# Patient Record
Sex: Female | Born: 1995 | Race: Black or African American | Hispanic: No | Marital: Single | Smoking: Current some day smoker
Health system: Southern US, Community
[De-identification: ages and names within clinical notes are randomized; demographics above are authoritative.]

---
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
## 2016-01-27 ENCOUNTER — Encounter (HOSPITAL_COMMUNITY): Payer: Self-pay | Admitting: *Deleted

## 2016-01-27 ENCOUNTER — Emergency Department (HOSPITAL_COMMUNITY)
Admission: EM | Admit: 2016-01-27 | Discharge: 2016-01-27 | Disposition: A | Discharge status: Home / Self Care | Payer: Medicaid Other | Attending: Emergency Medicine | Admitting: Emergency Medicine

## 2016-01-27 ENCOUNTER — Emergency Department (HOSPITAL_COMMUNITY): Payer: Medicaid Other

## 2016-01-27 DIAGNOSIS — Z792 Long term (current) use of antibiotics: Secondary | ICD-10-CM | POA: Insufficient documentation

## 2016-01-27 DIAGNOSIS — Y998 Other external cause status: Secondary | ICD-10-CM | POA: Diagnosis not present

## 2016-01-27 DIAGNOSIS — Y9389 Activity, other specified: Secondary | ICD-10-CM | POA: Diagnosis not present

## 2016-01-27 DIAGNOSIS — Z791 Long term (current) use of non-steroidal anti-inflammatories (NSAID): Secondary | ICD-10-CM | POA: Insufficient documentation

## 2016-01-27 DIAGNOSIS — Z7952 Long term (current) use of systemic steroids: Secondary | ICD-10-CM | POA: Insufficient documentation

## 2016-01-27 DIAGNOSIS — S90111A Contusion of right great toe without damage to nail, initial encounter: Secondary | ICD-10-CM | POA: Insufficient documentation

## 2016-01-27 DIAGNOSIS — W208XXA Other cause of strike by thrown, projected or falling object, initial encounter: Secondary | ICD-10-CM | POA: Diagnosis not present

## 2016-01-27 DIAGNOSIS — Y9289 Other specified places as the place of occurrence of the external cause: Secondary | ICD-10-CM | POA: Insufficient documentation

## 2016-01-27 DIAGNOSIS — S99921A Unspecified injury of right foot, initial encounter: Secondary | ICD-10-CM | POA: Diagnosis present

## 2016-01-27 MED ORDER — ACETAMINOPHEN 325 MG PO TABS
650.0000 mg | ORAL_TABLET | Freq: Once | ORAL | Status: AC
  Administered 2016-01-27: 650 mg via ORAL
  Filled 2016-01-27: qty 2

## 2016-01-27 MED ORDER — NAPROXEN 500 MG PO TABS: 500.0000 mg | ORAL_TABLET | Freq: Two times a day (BID) | ORAL | Status: AC

## 2016-01-27 MED ORDER — HYDROCODONE-ACETAMINOPHEN 5-325 MG PO TABS: 1.0000 | ORAL_TABLET | ORAL | Status: AC | PRN

## 2016-01-27 MED ORDER — NAPROXEN 250 MG PO TABS
500.0000 mg | ORAL_TABLET | Freq: Once | ORAL | Status: AC
  Administered 2016-01-27: 500 mg via ORAL
  Filled 2016-01-27: qty 2

## 2016-01-27 NOTE — ED Provider Notes (Signed)
CSN: [id number]     Arrival date & time 01/27/16  1406 History  By signing my name below, I, Octavia Heir, attest that this documentation has been prepared under the direction and in the presence of Demitrious Mccannon Y Oma Marzan, [place]. Electronically Signed: Octavia Heir, ED Scribe. 01/27/2016. 2:35 PM.    Chief Complaint  Patient presents with  . Toe Pain      The history is provided by the patient. No language interpreter was used.   HPI Comments: Jessica Hopkins is a 20 y.o. female who presents to the Emergency Department complaining of a sudden onset, gradual worsening, moderate, 10/10 right big toe pain with associated swelling onset 40 minutes ago. Pt states that she she was closing the screen door at her house (which is broken) when it fell and landed on her right big toe. Pt says the pain shoots up her right leg and she notes having some numbness. Pt is unable to bend her toes and is able to ambulate but expresses increased pain. She has not taken any medication to alleviate the pain. Denies tingling, weakness.  No past medical history on file. No past surgical history on file. No family history on file. Social History  Substance Use Topics  . Smoking status: Never Smoker   . Smokeless tobacco: Not on file  . Alcohol Use: No   OB History    No data available     Review of Systems  Musculoskeletal: Positive for arthralgias (right big toe).  Neurological: Positive for numbness.  All other systems reviewed and are negative.     Allergies  Review of patient's allergies indicates no known allergies.  Home Medications   Prior to Admission medications   Medication Sig Start Date End Date Taking? Authorizing Provider  amoxicillin (AMOXIL) 500 MG capsule Take 1 capsule (500 mg total) by mouth 3 (three) times daily. 12/01/15   Nicole Pisciotta, PA-C  metroNIDAZOLE (FLAGYL) 500 MG tablet Take 1 tablet (500 mg total) by mouth 2 (two) times daily. 11/16/15   Chase Picket Ward, PA-C  naproxen  (NAPROSYN) 500 MG tablet Take 1 tablet (500 mg total) by mouth 2 (two) times daily. 11/16/15   Chase Picket Ward, PA-C  predniSONE (DELTASONE) 20 MG tablet Take 2 tablets (40 mg total) by mouth daily. 12/01/15   Nicole Pisciotta, PA-C   Triage vitals: BP 125/72 mmHg  Pulse 88  Temp(Src) 97.7 F (36.5 C) (Oral)  Resp 18  Ht  (1.6 m)  Wt 180 lb (81.647 kg)  BMI 31.89 kg/m2  SpO2 100%  LMP 01/27/2016 Physical Exam  Constitutional: She is oriented to person, place, and time. She appears well-developed and well-nourished.  HENT:  Head: Normocephalic.  Eyes: EOM are normal.  Neck: Normal range of motion.  Pulmonary/Chest: Effort normal.  Abdominal: She exhibits no distension.  Musculoskeletal: Normal range of motion.  Right toe is not edematous or erythematous. Right great toe with tenderness at base of nail and IP joint. Brisk cap refill, limited ROM due to pain, 2+ distal pulses.   Neurological: She is alert and oriented to person, place, and time.  Psychiatric: She has a normal mood and affect.  Nursing note and vitals reviewed.   ED Course  Procedures  DIAGNOSTIC STUDIES: Oxygen Saturation is 100% on RA, normal by my interpretation.  COORDINATION OF CARE:  2:22 PM Will order DG of right foot. Discussed treatment plan which includes naproxen and tylenol with pt at bedside and pt agreed to plan.  Labs  Review Labs Reviewed - No data to display  Imaging Review Dg Foot Complete Right  01/27/2016  CLINICAL DATA:  20 year old female with a history of right foot pain at the great toe. Injury EXAM: RIGHT FOOT COMPLETE - 3+ VIEW COMPARISON:  05/09/2015 FINDINGS: There is no evidence of fracture or dislocation. There is no evidence of arthropathy or other focal bone abnormality. Soft tissues are unremarkable. IMPRESSION: Negative. Signed, Yvone NeuJaime S. Loreta AveWagner, DO [facility] Electronically Signed   By: Gilmer MorJaime  Wagner D.O.   On:  01/27/2016 15:15   I have personally reviewed and evaluated these images and lab results as part of my medical decision-making.   EKG Interpretation None      MDM   Final diagnoses:  Contusion of great toe, right, initial encounter   X-ray negative for acute findings. Pt reports continued severe pain minimally improved with naproxen and tylenol. She is driving home. Discussed with her i can give her short course of norco plus naproxen at home. Encouraged RICE therapy. Great toe and second toe buddy taped for support. Resource guide given to establish PCP for f/u. ER return precautions given.   I personally performed the services described in this documentation, which was scribed in my presence. The recorded information has been reviewed and is accurate.   Carlene CoriaSerena Y Colleene Swarthout, PA-C 01/27/16 1544  Margarita Grizzleanielle Ray, MD 01/28/16 [id number]

## 2016-01-27 NOTE — Discharge Instructions (Signed)
Take pain medications as needed for pain. Keep your foot elevated when possible. Use ice on and off for the next 48 hours. Return to the emergency room for worsening condition or new concerning symptoms. Follow up with your regular doctor. If you don't have a regular doctor use one of the numbers below to establish a primary care doctor.   Emergency Department Resource Guide 1) Find a Doctor and Pay Out of Pocket Although you won't have to find out who is covered by your insurance plan, it is a good idea to ask around and get recommendations. You will then need to call the office and see if the doctor you have chosen will accept you as a new patient and what types of options they offer for patients who are self-pay. Some doctors offer discounts or will set up payment plans for their patients who do not have insurance, but you will need to ask so you aren't surprised when you get to your appointment.  2) Contact Your Local Health Department Not all health departments have doctors that can see patients for sick visits, but many do, so it is worth a call to see if yours does. If you don't know where your local health department is, you can check in your phone book. The CDC also has a tool to help you locate your state's health department, and many state websites also have listings of all of their local health departments.  3) Find a Walk-in Clinic If your illness is not likely to be very severe or complicated, you may want to try a walk in clinic. These are popping up all over the country in pharmacies, drugstores, and shopping centers. They're usually staffed by nurse practitioners or physician assistants that have been trained to treat common illnesses and complaints. They're usually fairly quick and inexpensive. However, if you have serious medical issues or chronic medical problems, these are probably not your best option.  No Primary Care Doctor: - Call Health Connect at  [phone] - they can help you  locate a primary care doctor that  accepts your insurance, provides certain services, etc. - Physician Referral [phone]  Emergency Department Resource Guide 1) Find a Doctor and Pay Out of Pocket Although you won't have to find out who is covered by your insurance plan, it is a good idea to ask around and get recommendations. You will then need to call the office and see if the doctor you have chosen will accept you as a new patient and what types of options they offer for patients who are self-pay. Some doctors offer discounts or will set up payment plans for their patients who do not have insurance, but you will need to ask so you aren't surprised when you get to your appointment.  2) Contact Your Local Health Department Not all health departments have doctors that can see patients for sick visits, but many do, so it is worth a call to see if yours does. If you don't know where your local health department is, you can check in your phone book. The CDC also has a tool to help you locate your state's health department, and many state websites also have listings of all of their local health departments.  3) Find a Walk-in Clinic If your illness is not likely to be very severe or complicated, you may want to try a walk in clinic. These are popping up all over the country in pharmacies, drugstores, and shopping centers. They're usually staffed  by nurse practitioners or physician assistants that have been trained to treat common illnesses and complaints. They're usually fairly quick and inexpensive. However, if you have serious medical issues or chronic medical problems, these are probably not your best option.  No Primary Care Doctor: - Call Health Connect at  [phone] - they can help you locate a primary care doctor that  accepts your insurance, provides certain services, etc. - Physician Referral Service- [phone]  Chronic Pain Problems: Organization         Address  Phone    Notes  [facility]  [phone] Patients need to be referred by their primary care doctor.   Medication Assistance: Organization         Address  Phone   Notes  [facility] Medication [facility] [address] [place], [place] [address] [phone] --Must be a resident of [facility] [place] -- Must have NO insurance coverage whatsoever (no Medicaid/ Medicare, etc.) -- The pt. MUST have a primary care doctor that directs their care regularly and follows them in the community   MedAssist  [phone]   [employer]  [phone]    Agencies that provide inexpensive medical care: Organization         Address  Phone   Notes  [facility]  [phone]   [facility]    [phone]   [facility] [address] [place], [place] [address] [phone]   [facility] [address], [place] [phone]   [facility]    [phone]) [phone]   [facility]    [phone]   [facility]  [address], [place] Phone:  [phone], Fax:  [phone] Hours of Operation:  9 am - 6 pm, M-F.  Also accepts Medicaid/Medicare and self-pay.  [facility]  [address], [place] Phone: [phone], Fax: [phone]. Hours of Operation:  8:30 am - 5:30 pm, M-F.  Also accepts Medicaid and self-pay.  [facility] [place] [address], [place] Phone: [phone]) [phone]   [facility] [address], [place], [place] [phone], Ext. [phone] Mondays & Thursdays: 7-9 AM.  First 15 patients are seen on a first come, first serve basis.    Medicaid-accepting [facility] Providers:  Organization         Address  Phone   Notes  [facility] [address], [place] [phone] Also accepts self-pay patients.  [facility]  [address], [place]  [phone]   [facility] [address], [place] [phone]   [facility] [address] [place] [address], [place] [phone]   [facility]akers[facility] Bland [address], [place]   [phone] Only accepts [place] patients after they have their name applied to their card.   Self-Pay (no insurance) in [facility] [place]:  Organization         Address  Phone   Notes  Sickle Cell Patients, [facility] [address], [place] [phone]   [facility] [address], [place] [phone]   [facility]  [address], [place] [phone]) [phone]   [facility]/Dr.  Osei-Bonsu  [address] [place] or [address], [place] [phone] Phone number for both [place] and [place] locations is the same.  [facility] [address] [place] [phone]   [facility] [address], [place] or [address] [phone]   [facility] [address], [place] [phone], phone; [phone], fax Sees patients 1st and 3rd Saturday of every month.  Must not qualify for public or private insurance (i.e. Medicaid, Medicare, [place] Health Choice, Veterans' Benefits)  Household income should be no more than 200% of the poverty level The clinic cannot treat you if you are pregnant or think you are pregnant  Sexually transmitted diseases are not treated at the clinic.

## 2016-01-27 NOTE — ED Notes (Signed)
PT reports a door fell on her RT great toe. Pain 10/10

## 2016-02-05 ENCOUNTER — Encounter (HOSPITAL_COMMUNITY): Payer: Self-pay

## 2016-02-05 ENCOUNTER — Emergency Department (HOSPITAL_COMMUNITY)
Admission: EM | Admit: 2016-02-05 | Discharge: 2016-02-05 | Discharge status: Absent without leave | Payer: Medicaid Other

## 2016-02-05 ENCOUNTER — Emergency Department (HOSPITAL_COMMUNITY)
Admission: EM | Admit: 2016-02-05 | Discharge: 2016-02-05 | Disposition: A | Discharge status: Home / Self Care | Payer: Medicaid Other | Attending: Emergency Medicine | Admitting: Emergency Medicine

## 2016-02-05 DIAGNOSIS — Z7952 Long term (current) use of systemic steroids: Secondary | ICD-10-CM | POA: Diagnosis not present

## 2016-02-05 DIAGNOSIS — R195 Other fecal abnormalities: Secondary | ICD-10-CM | POA: Diagnosis present

## 2016-02-05 DIAGNOSIS — Z792 Long term (current) use of antibiotics: Secondary | ICD-10-CM | POA: Diagnosis not present

## 2016-02-05 DIAGNOSIS — Z791 Long term (current) use of non-steroidal anti-inflammatories (NSAID): Secondary | ICD-10-CM | POA: Insufficient documentation

## 2016-02-05 NOTE — ED Notes (Signed)
Patient reports that she has had 2 bowel movements and noticed a pinkish/white "worm" in stool. No distress, no pain

## 2016-02-05 NOTE — ED Notes (Signed)
PT provided with stool collection kit and order placed by PA

## 2016-02-05 NOTE — ED Notes (Signed)
Declined W/C at D/C and was escorted to lobby by RN. 

## 2016-02-05 NOTE — Discharge Instructions (Signed)
You were given a stool sample collection kit today. Please provide a stool sample and bring to the lab for testing. Return to the emergency department for any new or worsening symptoms, any additional concerns.

## 2016-02-05 NOTE — ED Provider Notes (Signed)
CSN: [id number]     Arrival date & time 02/05/16  6314 History  By signing my name below, I, Jessica Hopkins, attest that this documentation has been prepared under the direction and in the presence of Jessica Oyster, PA-C Electronically Signed: Ladene Artist, ED Scribe 02/05/2016 at 10:51 AM   Chief Complaint  Patient presents with  . wants stool checked/worms    The history is provided by the patient. No language interpreter was used.   HPI Comments: Jessica Hopkins is a 20 y.o. female who presents to the Emergency Department to have her stool checked. Pt states that she had 2 BMs and noticed 2 long pinkish/white "worms" in her stools 6 days ago and again 3 days ago. Pt further describes the worms as "slimy" and states that she noticed them on her toilet tissue after wiping. Pt denies fever, diarrhea, constipation, abdominal pain, blood in stool. She also denies new foods, raw foods, recent travel, swimming in lakes or oceans.    History reviewed. No pertinent past medical history. History reviewed. No pertinent past surgical history. No family history on file. Social History  Substance Use Topics  . Smoking status: Never Smoker   . Smokeless tobacco: None  . Alcohol Use: No   OB History    No data available     Review of Systems  Constitutional: Negative for fever.  Gastrointestinal: Negative for abdominal pain, diarrhea, constipation and blood in stool.   Allergies  Review of patient's allergies indicates no known allergies.  Home Medications   Prior to Admission medications   Medication Sig Start Date End Date Taking? Authorizing Provider  amoxicillin (AMOXIL) 500 MG capsule Take 1 capsule (500 mg total) by mouth 3 (three) times daily. 12/01/15   Nicole Pisciotta, PA-C  HYDROcodone-acetaminophen (NORCO/VICODIN) 5-325 MG tablet Take 1 tablet by mouth every 4 (four) hours as needed for severe pain. 01/27/16   Olivia Canter Sam, PA-C  metroNIDAZOLE (FLAGYL) 500 MG tablet Take 1 tablet (500  mg total) by mouth 2 (two) times daily. 11/16/15   Ozella Almond Clarke Amburn, PA-C  naproxen (NAPROSYN) 500 MG tablet Take 1 tablet (500 mg total) by mouth 2 (two) times daily. 11/16/15   Ozella Almond Marshal Schrecengost, PA-C  naproxen (NAPROSYN) 500 MG tablet Take 1 tablet (500 mg total) by mouth 2 (two) times daily. 01/27/16   Olivia Canter Sam, PA-C  predniSONE (DELTASONE) 20 MG tablet Take 2 tablets (40 mg total) by mouth daily. 12/01/15   Nicole Pisciotta, PA-C   BP 126/112 mmHg  Pulse 75  Temp(Src) 98.4 F (36.9 C) (Oral)  Resp 18  Ht '5\' 3"'  (1.6 m)  Wt 180 lb (81.647 kg)  BMI 31.89 kg/m2  SpO2 94%  LMP 01/27/2016 Physical Exam  Constitutional: She is oriented to person, place, and time. She appears well-developed and well-nourished. No distress.  HENT:  Head: Normocephalic and atraumatic.  Neck: Neck supple. No tracheal deviation present.  Cardiovascular: Normal rate, regular rhythm and normal heart sounds.   Pulmonary/Chest: Effort normal and breath sounds normal. No respiratory distress. She has no wheezes. She has no rales. She exhibits no tenderness.  Abdominal: Soft. Bowel sounds are normal. She exhibits no distension and no mass. There is no tenderness. There is no rebound and no guarding.  Musculoskeletal: Normal range of motion.  Neurological: She is alert and oriented to person, place, and time.  Skin: Skin is warm and dry.  Psychiatric: She has a normal mood and affect. Her behavior is normal.  Nursing  note and vitals reviewed.  ED Course  Procedures (including critical care time) DIAGNOSTIC STUDIES: Oxygen Saturation is 94% on RA, adequate by my interpretation.    COORDINATION OF CARE: 10:08 AM-Discussed treatment plan which includes ova and parasite examination and f/u with PCP with pt at bedside and pt agreed to plan.   Labs Review Labs Reviewed - No data to display  Imaging Review No results found.   EKG Interpretation None      MDM   Final diagnoses:  Abnormal stools    [name] presents to the emergency Department were courier of having worms in her stool. She has twice in the last week they just a long thin slimy wormlike structure on her tissue paper when wiping after a bowel movement. She has no risk factors including no recent travel, swimming in outside water, no raw foods, no new foods, no blood in the stool, no abdominal symptoms. She presents today asymptomatic with a benign abdominal exam. She does not currently need to have a bowel movement, therefore cannot obtain a stool sample at this time. Case was discussed with attending Dr. Johnney Killian who recommends setting up an outpatient ova and parasites stool sample. Lab was contacted to arrange this. Patient was sent home with a stool collection kit and orders placed for O&P to be performed lab. PCP follow up strongly encouraged. Return precautions were given and all questions answered.  I personally performed the services described in this documentation, which was scribed in my presence. The recorded information has been reviewed and is accurate.   [facility] Chauna Osoria, PA-C 02/05/16 1129  Charlesetta Shanks, MD 02/05/16 [phone]

## 2016-02-08 LAB — O&P RESULT

## 2016-02-08 LAB — OVA + PARASITE EXAM

## 2016-07-10 IMAGING — CR DG FOOT COMPLETE 3+V*R*
3 series · 3 of 3 positions shown · non-contrast
Comparison: None.

CLINICAL DATA: Stepped on glass.

EXAM:
RIGHT FOOT COMPLETE - 3+ VIEW

[foot ap]
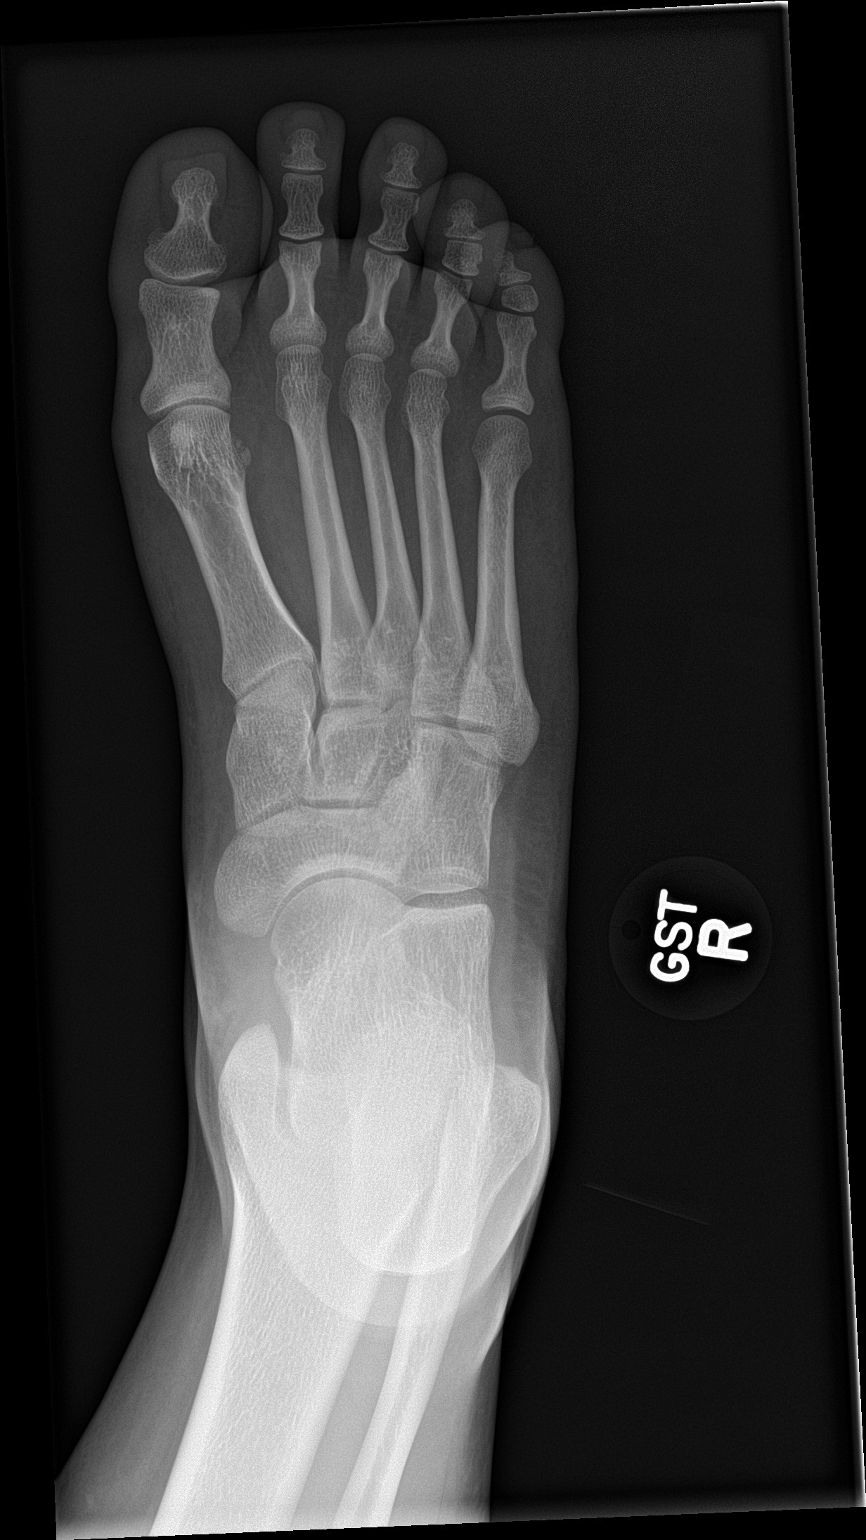

[foot obl]
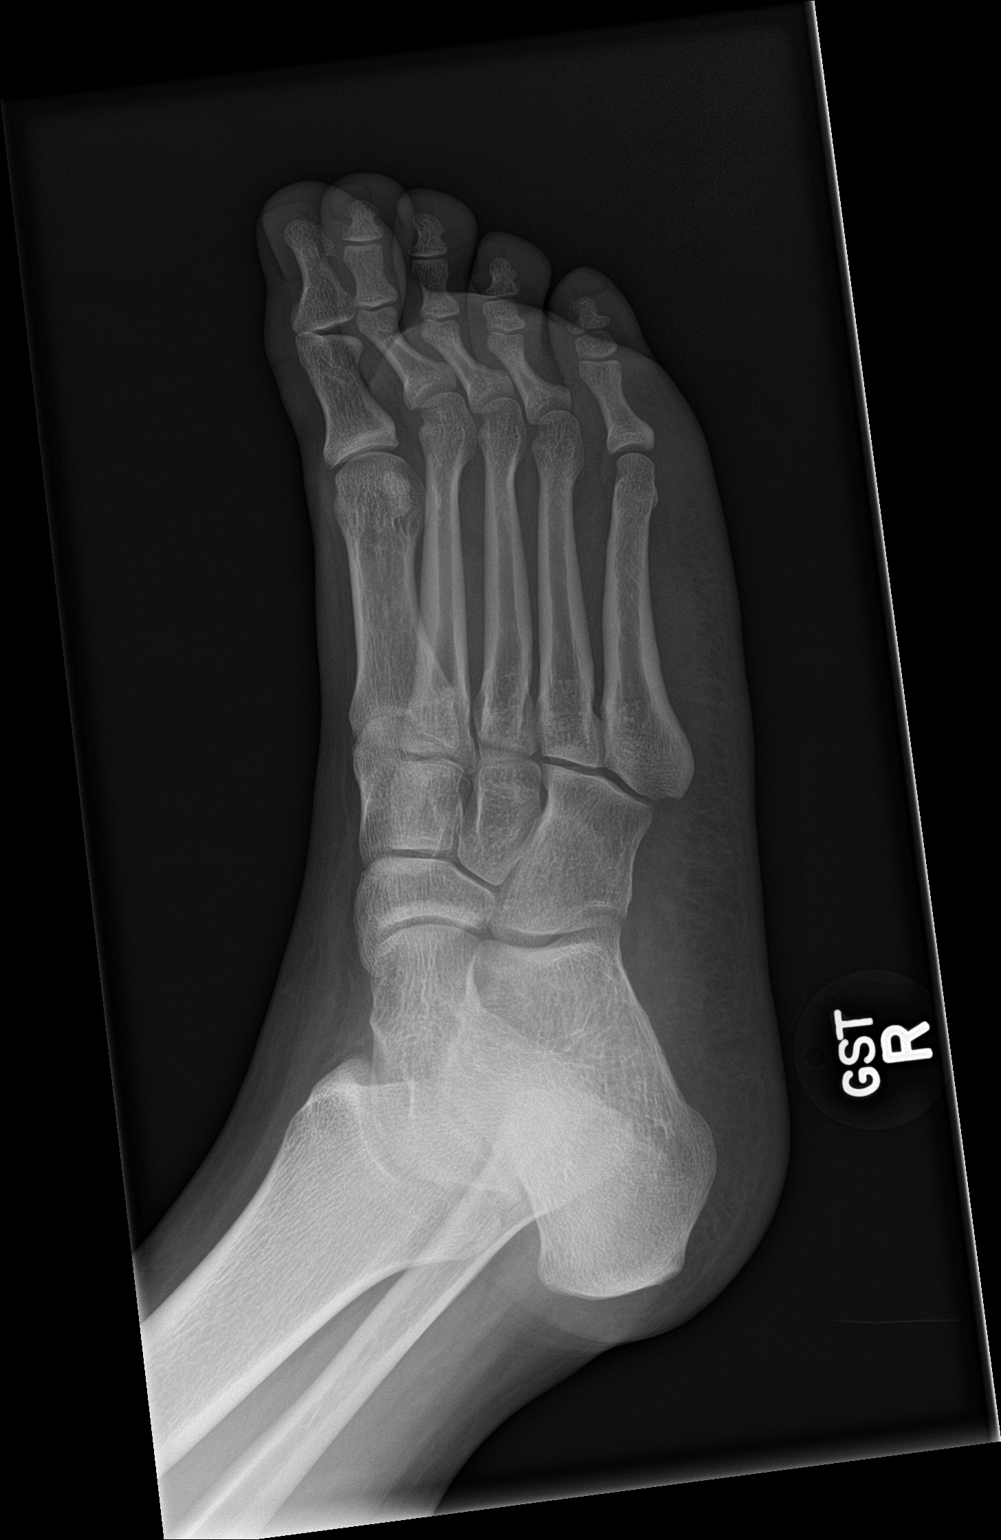

[foot lat]
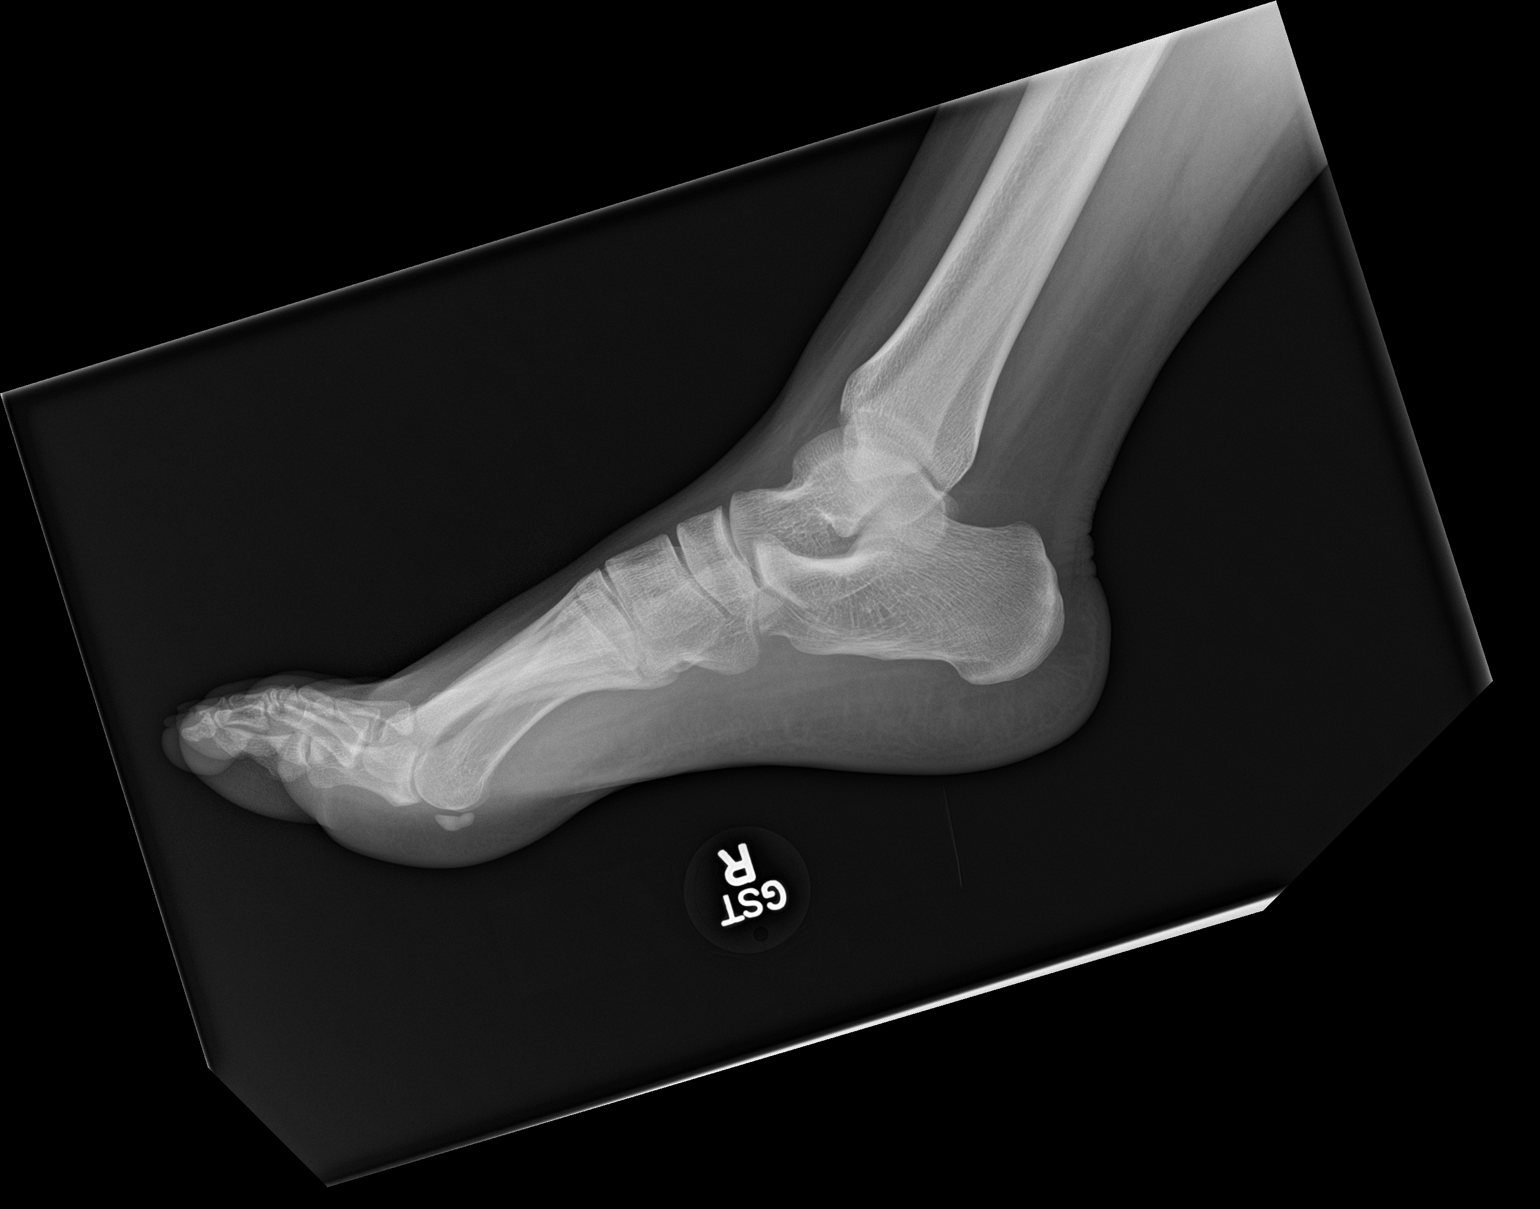

[3 of 3 positions shown; findings below may reference images not displayed]

FINDINGS: Negative for fracture, dislocation or radiopaque foreign body. No
soft tissue gas.
IMPRESSION: Negative.

## 2017-01-10 ENCOUNTER — Encounter (HOSPITAL_COMMUNITY): Payer: Self-pay | Admitting: Nurse Practitioner

## 2017-01-10 ENCOUNTER — Emergency Department (HOSPITAL_COMMUNITY)
Admission: EM | Admit: 2017-01-10 | Discharge: 2017-01-10 | Disposition: A | Discharge status: Home / Self Care | Payer: Medicaid Other | Attending: Emergency Medicine | Admitting: Emergency Medicine

## 2017-01-10 DIAGNOSIS — A5901 Trichomonal vulvovaginitis: Secondary | ICD-10-CM | POA: Insufficient documentation

## 2017-01-10 DIAGNOSIS — B9689 Other specified bacterial agents as the cause of diseases classified elsewhere: Secondary | ICD-10-CM | POA: Diagnosis not present

## 2017-01-10 DIAGNOSIS — N390 Urinary tract infection, site not specified: Secondary | ICD-10-CM | POA: Diagnosis not present

## 2017-01-10 DIAGNOSIS — F17200 Nicotine dependence, unspecified, uncomplicated: Secondary | ICD-10-CM | POA: Insufficient documentation

## 2017-01-10 DIAGNOSIS — Z79899 Other long term (current) drug therapy: Secondary | ICD-10-CM | POA: Insufficient documentation

## 2017-01-10 DIAGNOSIS — A599 Trichomoniasis, unspecified: Secondary | ICD-10-CM

## 2017-01-10 DIAGNOSIS — R350 Frequency of micturition: Secondary | ICD-10-CM | POA: Diagnosis present

## 2017-01-10 DIAGNOSIS — N760 Acute vaginitis: Secondary | ICD-10-CM | POA: Diagnosis not present

## 2017-01-10 LAB — URINALYSIS, ROUTINE W REFLEX MICROSCOPIC
Bilirubin Urine: NEGATIVE
Glucose, UA: NEGATIVE mg/dL
Ketones, ur: 5 mg/dL — AB
Nitrite: POSITIVE — AB
Protein, ur: 100 mg/dL — AB
SPECIFIC GRAVITY, URINE: 1.02 (ref 1.005–1.030)
pH: 6 (ref 5.0–8.0)

## 2017-01-10 LAB — POC URINE PREG, ED: PREG TEST UR: NEGATIVE

## 2017-01-10 LAB — WET PREP, GENITAL
Sperm: NONE SEEN
Yeast Wet Prep HPF POC: NONE SEEN

## 2017-01-10 MED ORDER — AZITHROMYCIN 250 MG PO TABS
1000.0000 mg | ORAL_TABLET | Freq: Once | ORAL | Status: AC
  Administered 2017-01-10: 1000 mg via ORAL
  Filled 2017-01-10: qty 4

## 2017-01-10 MED ORDER — METRONIDAZOLE 500 MG PO TABS: 500.0000 mg | ORAL_TABLET | Freq: Two times a day (BID) | ORAL | 0 refills | Status: AC

## 2017-01-10 MED ORDER — CEFTRIAXONE SODIUM 250 MG IJ SOLR
250.0000 mg | Freq: Once | INTRAMUSCULAR | Status: AC
  Administered 2017-01-10: 250 mg via INTRAMUSCULAR
  Filled 2017-01-10: qty 250

## 2017-01-10 MED ORDER — LIDOCAINE HCL (PF) 1 % IJ SOLN
INTRAMUSCULAR | Status: AC
  Filled 2017-01-10: qty 5

## 2017-01-10 MED ORDER — CIPROFLOXACIN HCL 250 MG PO TABS
250.0000 mg | ORAL_TABLET | Freq: Two times a day (BID) | ORAL | 0 refills | Status: AC
Start: 2017-01-11 — End: 2017-01-16

## 2017-01-10 NOTE — ED Triage Notes (Addendum)
Pt presents with c/o urinary frequency. The urinary frequency began 3 days ago. She reports urinary hesitancy, urinary pressure, foul smelling vaginal discharge, blood on toilet paper after urinating.  She denies fevers, chills, nausea, vomiting, abd pain, flank pain. She has not tried anything at home for her symptoms.

## 2017-01-10 NOTE — Discharge Instructions (Signed)
Jessica Hopkins,  Your workup shows that your symptoms may be related to a urinary tract infection, trichomoniasis, and bacterial vaginosis. While you were in the emergency department we treated you for chlamydia and gonorrhea just in case but the results of whether or not you have these infections have not come back yet. You will get a call if these results end up being positive and hear nothing if they are negative. Please complete the courses of these other two antibiotics, follow the directions on the bottles. Trichomoniasis and bacterial vaginosis can be transmitted sexually so your partner will need to be treated as well to prevent you from getting these infections again. Schedule a follow up appointment with a primary care doctor. Return to the emergency department if your symptoms worsen or fail to improve.

## 2017-01-10 NOTE — ED Provider Notes (Signed)
[facility] DEPT Provider Note   CSN: [id number] Arrival date & time: 01/10/17  1539     History   Chief Complaint Chief Complaint  Patient presents with  . Urinary Frequency    HPI Jessica Hopkins is a 21 y.o. female presents with frequency and incomplete bladder emptying and fishy vaginal discharge for the past 4 days. She also noticed clots of blood when she wiped after urinating yesterday. LMP was in early march, she cannot recall the dates. She denies dysuria, abdominal pain, back pain, changes in bowel movements, fever, or nausea. She has been with 1 sexual partner in the past 6 months. They do not use contraceptives.   HPI  History reviewed. No pertinent past medical history.  There are no active problems to display for this patient.   History reviewed. No pertinent surgical history.  OB History    No data available       Home Medications    Prior to Admission medications   Medication Sig Start Date End Date Taking? Authorizing Provider  amoxicillin (AMOXIL) 500 MG capsule Take 1 capsule (500 mg total) by mouth 3 (three) times daily. 12/01/15   Nicole Pisciotta, PA-C  HYDROcodone-acetaminophen (NORCO/VICODIN) 5-325 MG tablet Take 1 tablet by mouth every 4 (four) hours as needed for severe pain. 01/27/16   Ace Gins Sam, PA-C  metroNIDAZOLE (FLAGYL) 500 MG tablet Take 1 tablet (500 mg total) by mouth 2 (two) times daily. 11/16/15   Chase Picket Ward, PA-C  naproxen (NAPROSYN) 500 MG tablet Take 1 tablet (500 mg total) by mouth 2 (two) times daily. 11/16/15   Chase Picket Ward, PA-C  naproxen (NAPROSYN) 500 MG tablet Take 1 tablet (500 mg total) by mouth 2 (two) times daily. 01/27/16   Ace Gins Sam, PA-C  predniSONE (DELTASONE) 20 MG tablet Take 2 tablets (40 mg total) by mouth daily. 12/01/15   Joni Reining Pisciotta, PA-C    Family History History reviewed. No pertinent family history.  Social History Social History  Substance Use Topics  . Smoking status: Current Some  Day Smoker  . Smokeless tobacco: Never Used  . Alcohol use No     Allergies   Patient has no known allergies.   Review of Systems Review of Systems  Constitutional: Negative for chills and fever.  Gastrointestinal: Negative for abdominal pain, constipation, diarrhea and nausea.  Genitourinary: Positive for decreased urine volume, difficulty urinating, urgency and vaginal discharge. Negative for dysuria, flank pain and pelvic pain.  Musculoskeletal: Negative for back pain.  All other systems reviewed and are negative.    Physical Exam Updated Vital Signs BP 128/71 (BP Location: Right Arm)   Pulse 97   Temp 98.2 F (36.8 C) (Oral)   Resp 18   LMP 12/12/2016   SpO2 99%   Physical Exam  Constitutional: She is oriented to person, place, and time. She appears well-developed and well-nourished. No distress.  Non toxic appearing   HENT:  Head: Normocephalic and atraumatic.  Eyes: Conjunctivae are normal. No scleral icterus.  Cardiovascular: Normal rate and regular rhythm.   No murmur heard. Pulmonary/Chest: Effort normal and breath sounds normal. No respiratory distress. She has no wheezes. She has no rales.  Abdominal: Soft. She exhibits no distension and no mass. There is no tenderness. There is no guarding.  She feels fullness with suprapubic palpation  No costovertebral angle tenderness   Genitourinary: Vagina normal. There is no rash or lesion on the right labia. There is no rash or lesion on the  left labia. Cervix exhibits no discharge and no friability. Right adnexum displays no mass, no tenderness and no fullness. Left adnexum displays no mass, no tenderness and no fullness. No erythema or tenderness in the vagina. No vaginal discharge found.  Neurological: She is alert and oriented to person, place, and time.  Skin: Skin is warm and dry. She is not diaphoretic.  Psychiatric: She has a normal mood and affect. Her behavior is normal.     ED Treatments / Results   Labs (all labs ordered are listed, but only abnormal results are displayed) Labs Reviewed  URINALYSIS, ROUTINE W REFLEX MICROSCOPIC  POC URINE PREG, ED  WET PREP  (BD AFFIRM) ([place])  GC/CHLAMYDIA PROBE AMP ([place]) NOT AT [facility]    EKG  EKG Interpretation None       Radiology No results found.  Procedures Procedures (including critical care time)  Medications Ordered in ED Medications - No data to display   Initial Impression / Assessment and Plan / ED Course  I have reviewed the triage vital signs and the nursing notes.  Pertinent labs & imaging results that were available during my care of the patient were reviewed by me and considered in my medical decision making (see chart for details).    21 year old female presents with sensation of incomplete bladder emptying, urinary frequency, and fishy vaginal discharge. She was non toxic appearing with stable vital signs. Vaginal exam revealed no lesions. Wet prep was positive for trichomoniasis and clue cells. Urinalysis showed large leukocytes and nitrites. Gonorrhea and chlamydia results are not back yet but I have given her a dose of rocephin 250 mg IM and azithromax 1 g empirically. Prescribed Cipro 250 mg BID for 3 days and Metronidazole 500 mg BID for 7 days. Planned for close follow up with a PCP and instructed her to have sexual partner treated for trichomoniasis. Discussed return precautions.    Final Clinical Impressions(s) / ED Diagnoses   Final diagnoses:  None    New Prescriptions New Prescriptions   No medications on file     Eulah Pont, MD 01/10/17 1940    Eulah Pont, MD 01/10/17 1942    Geoffery Lyons, MD 01/10/17 [phone]

## 2017-01-10 NOTE — ED Notes (Signed)
Pelvic cart at bedside and ready Snacks given to patients son MD at bedside  Patient changing into gown

## 2017-01-14 LAB — GC/CHLAMYDIA PROBE AMP (~~LOC~~) NOT AT ARMC
CHLAMYDIA, DNA PROBE: NEGATIVE
Neisseria Gonorrhea: NEGATIVE

## 2021-11-29 ENCOUNTER — Other Ambulatory Visit (HOSPITAL_BASED_OUTPATIENT_CLINIC_OR_DEPARTMENT_OTHER): Payer: Self-pay | Admitting: Neurosurgery

## 2021-11-29 DIAGNOSIS — G8929 Other chronic pain: Secondary | ICD-10-CM

## 2021-11-29 DIAGNOSIS — M5441 Lumbago with sciatica, right side: Secondary | ICD-10-CM

## 2021-12-01 ENCOUNTER — Other Ambulatory Visit: Payer: Self-pay

## 2021-12-01 ENCOUNTER — Ambulatory Visit (HOSPITAL_BASED_OUTPATIENT_CLINIC_OR_DEPARTMENT_OTHER)
Admission: RE | Admit: 2021-12-01 | Discharge: 2021-12-01 | Disposition: A | Discharge status: Home / Self Care | Payer: Medicaid Other | Source: Ambulatory Visit | Attending: Neurosurgery | Admitting: Neurosurgery

## 2021-12-01 DIAGNOSIS — G8929 Other chronic pain: Secondary | ICD-10-CM

## 2021-12-01 DIAGNOSIS — M5441 Lumbago with sciatica, right side: Secondary | ICD-10-CM | POA: Diagnosis present

## 2023-05-18 ENCOUNTER — Encounter (HOSPITAL_COMMUNITY): Payer: Self-pay | Admitting: Obstetrics & Gynecology

## 2023-05-18 ENCOUNTER — Inpatient Hospital Stay (HOSPITAL_COMMUNITY)
Admission: AD | Admit: 2023-05-18 | Discharge: 2023-05-18 | Disposition: A | Discharge status: Home / Self Care | Payer: Medicaid Other | Attending: Obstetrics & Gynecology | Admitting: Obstetrics & Gynecology

## 2023-05-18 ENCOUNTER — Inpatient Hospital Stay (HOSPITAL_COMMUNITY): Payer: Medicaid Other

## 2023-05-18 DIAGNOSIS — O26891 Other specified pregnancy related conditions, first trimester: Secondary | ICD-10-CM | POA: Diagnosis not present

## 2023-05-18 DIAGNOSIS — O23591 Infection of other part of genital tract in pregnancy, first trimester: Secondary | ICD-10-CM | POA: Insufficient documentation

## 2023-05-18 DIAGNOSIS — O209 Hemorrhage in early pregnancy, unspecified: Secondary | ICD-10-CM | POA: Insufficient documentation

## 2023-05-18 DIAGNOSIS — M542 Cervicalgia: Secondary | ICD-10-CM | POA: Insufficient documentation

## 2023-05-18 DIAGNOSIS — N939 Abnormal uterine and vaginal bleeding, unspecified: Secondary | ICD-10-CM | POA: Diagnosis not present

## 2023-05-18 DIAGNOSIS — Z3A01 Less than 8 weeks gestation of pregnancy: Secondary | ICD-10-CM | POA: Diagnosis not present

## 2023-05-18 DIAGNOSIS — A5901 Trichomonal vulvovaginitis: Secondary | ICD-10-CM | POA: Diagnosis not present

## 2023-05-18 DIAGNOSIS — O98311 Other infections with a predominantly sexual mode of transmission complicating pregnancy, first trimester: Secondary | ICD-10-CM | POA: Diagnosis not present

## 2023-05-18 LAB — CBC
HCT: 41.7 % (ref 36.0–46.0)
Hemoglobin: 14 g/dL (ref 12.0–15.0)
MCH: 31 pg (ref 26.0–34.0)
MCHC: 33.6 g/dL (ref 30.0–36.0)
MCV: 92.5 fL (ref 80.0–100.0)
Platelets: 294 10*3/uL (ref 150–400)
RBC: 4.51 MIL/uL (ref 3.87–5.11)
RDW: 11.6 % (ref 11.5–15.5)
WBC: 8 10*3/uL (ref 4.0–10.5)
nRBC: 0 % (ref 0.0–0.2)

## 2023-05-18 LAB — WET PREP, GENITAL
Sperm: NONE SEEN
WBC, Wet Prep HPF POC: >=10 — AB (ref <10)
Yeast Wet Prep HPF POC: NONE SEEN

## 2023-05-18 LAB — ABO/RH: ABO/RH(D): O POS

## 2023-05-18 LAB — HCG, QUANTITATIVE, PREGNANCY: hCG, Beta Chain, Quant, S: 43928 m[IU]/mL — ABNORMAL HIGH (ref <5)

## 2023-05-18 MED ORDER — METRONIDAZOLE 500 MG PO TABS: 500.0000 mg | ORAL_TABLET | Freq: Two times a day (BID) | ORAL | 1 refills | Status: DC

## 2023-05-18 MED ORDER — METRONIDAZOLE 500 MG PO TABS: 500.0000 mg | ORAL_TABLET | Freq: Two times a day (BID) | ORAL | 0 refills | Status: AC

## 2023-05-18 NOTE — MAU Provider Note (Signed)
History     CSN: [id number]  Arrival date and time: 05/18/23 1441   Event Date/Time   First Provider Initiated Contact with Patient 05/18/23 1520      Chief Complaint  Patient presents with   Vaginal Bleeding   Back Pain   Neck Pain   Jessica Hopkins , a  27 y.o. [phone] at [redacted]w[redacted]d presents to MAU with complaints of vaginal spotting that started 1 hour ago. She states that she was coming in for neck and upper back pain that started Thursday. Patient states that it hurts when she turns her head from side to side. She believes she slept wrong. She states pain is relieved wit hot water to affected area but pain returns as the day progresses. Pain unrelieved with PO tylenol as well. She also reports light pink spotting that she only notices with wiping. She denies passing clots, or wearing a pad. She is very anxious as she had a miscarriage in a previous pregnancy that began the same way and results in a D&C. She denies abnormal vaginal discharge.        Vaginal Bleeding The patient's pertinent negatives include no pelvic pain or vaginal discharge. Associated symptoms include back pain. Pertinent negatives include no abdominal pain, chills, constipation, diarrhea, dysuria, fever, headaches, nausea or vomiting.  Back Pain Pertinent negatives include no abdominal pain, chest pain, dysuria, fever, headaches, pelvic pain or weakness.  Neck Pain  Pertinent negatives include no chest pain, fever, headaches or weakness.    OB History     Gravida  5   Para  3   Term  3   Preterm      AB  1   Living  3      SAB  1   IAB      Ectopic  0   Multiple      Live Births  3           No past medical history on file.  No past surgical history on file.  No family history on file.  Social History   Tobacco Use   Smoking status: Some Days   Smokeless tobacco: Never  Substance Use Topics   Alcohol use: No   Drug use: No    Allergies: No Known Allergies  Medications  Prior to Admission  Medication Sig Dispense Refill Last Dose   amoxicillin (AMOXIL) 500 MG capsule Take 1 capsule (500 mg total) by mouth 3 (three) times daily. 30 capsule 0    HYDROcodone-acetaminophen (NORCO/VICODIN) 5-325 MG tablet Take 1 tablet by mouth every 4 (four) hours as needed for severe pain. 6 tablet 0    naproxen (NAPROSYN) 500 MG tablet Take 1 tablet (500 mg total) by mouth 2 (two) times daily. 30 tablet 0    naproxen (NAPROSYN) 500 MG tablet Take 1 tablet (500 mg total) by mouth 2 (two) times daily. 30 tablet 0    predniSONE (DELTASONE) 20 MG tablet Take 2 tablets (40 mg total) by mouth daily. 10 tablet 0     Review of Systems  Constitutional:  Negative for chills, fatigue and fever.  Eyes:  Negative for pain and visual disturbance.  Respiratory:  Negative for apnea, shortness of breath and wheezing.   Cardiovascular:  Negative for chest pain and palpitations.  Gastrointestinal:  Negative for abdominal pain, constipation, diarrhea, nausea and vomiting.  Genitourinary:  Positive for vaginal bleeding. Negative for difficulty urinating, dysuria, pelvic pain, vaginal discharge and vaginal pain.  Musculoskeletal:  Positive  for back pain and neck pain.  Neurological:  Negative for seizures, weakness and headaches.  Psychiatric/Behavioral:  Negative for suicidal ideas.    Physical Exam   Blood pressure 126/84, pulse 90, temperature 98.7 F (37.1 C), temperature source Oral, resp. rate 14, height 5\' 3"  (1.6 m), weight 101.9 kg, last menstrual period 03/26/2023, SpO2 100%.  Physical Exam Vitals and nursing note reviewed.  Constitutional:      General: She is not in acute distress.    Appearance: Normal appearance.  HENT:     Head: Normocephalic.  Pulmonary:     Effort: Pulmonary effort is normal.  Musculoskeletal:        General: Tenderness present. Normal range of motion.     Cervical back: Normal range of motion.     Comments: To upper back and neck   Skin:    General:  Skin is warm and dry.  Neurological:     Mental Status: She is alert and oriented to person, place, and time.  Psychiatric:        Mood and Affect: Mood normal.     MAU Course  Procedures Orders Placed This Encounter  Procedures   Wet prep, genital   US OB LESS THAN 14 WEEKS WITH OB TRANSVAGINAL   CBC   hCG, quantitative, pregnancy   Diet NPO time specified   ABO/Rh    MDM - After further discussion, patient reports that she recently had her hair braided and head her head in a awkward position then slept In same position because her head was sore.  - Wet prep positive for Trich and BV. - Likely cause of spotting.  - GC pending upon discharge.  - CBC normal. Patient hemodynamically stable  - CNM independently reviewed [place] and noted a single living IUP equivalent with LMP.  - Plan for discharge   Assessment and Plan   1. Trichomonal vaginitis during pregnancy in first trimester   2. Vaginal spotting   3. Neck pain   4. [redacted] weeks gestation of pregnancy    - Reviewed that likely cause for spotting is Trichomonas in pregnancy. Reviewed that partner will also need testing and treatment. Also reviewed to abstain from intercourse or to use condoms until both parties have completed treatment. Patient verbalized understanding - Reviewed worsening signs and return precautions with patient.  - Patient plans to receive care at [facility] in [place]. Encouraged to keep appointment.   - Patient discharged home instable condition and may report to MAU as needed.   Claudette Head, MSN CNM  05/18/2023, 3:20 PM

## 2023-05-18 NOTE — MAU Note (Signed)
...  Jessica Hopkins is a 27 y.o. at Unknown here in MAU reporting: Light pink vaginal spotting that occurred approximately one hour ago. She reports upper back/neck pain since waking up this past Thursday. She reports she is unsure if she slept wrong. She reports she has not pain with sitting but when she turns her head there is pain. Denies recent IC. Denies vaginal discharge, vaginal odors, and vaginal itching.  LMP: 03/26/2023 Pain score: 5/10 upper back/neck pain  Lab orders placed from triage:  none

## 2023-07-28 ENCOUNTER — Other Ambulatory Visit: Payer: Self-pay

## 2023-07-28 ENCOUNTER — Emergency Department (HOSPITAL_BASED_OUTPATIENT_CLINIC_OR_DEPARTMENT_OTHER)
Admission: EM | Admit: 2023-07-28 | Discharge: 2023-07-28 | Disposition: A | Discharge status: Home / Self Care | Payer: Medicaid Other | Attending: Emergency Medicine | Admitting: Emergency Medicine

## 2023-07-28 ENCOUNTER — Encounter (HOSPITAL_BASED_OUTPATIENT_CLINIC_OR_DEPARTMENT_OTHER): Payer: Self-pay

## 2023-07-28 DIAGNOSIS — G44209 Tension-type headache, unspecified, not intractable: Secondary | ICD-10-CM | POA: Insufficient documentation

## 2023-07-28 DIAGNOSIS — O99282 Endocrine, nutritional and metabolic diseases complicating pregnancy, second trimester: Secondary | ICD-10-CM | POA: Diagnosis present

## 2023-07-28 DIAGNOSIS — E860 Dehydration: Secondary | ICD-10-CM | POA: Insufficient documentation

## 2023-07-28 LAB — CBC WITH DIFFERENTIAL/PLATELET
Abs Immature Granulocytes: 0.04 10*3/uL (ref 0.00–0.07)
Basophils Absolute: 0 10*3/uL (ref 0.0–0.1)
Basophils Relative: 0 %
Eosinophils Absolute: 0.1 10*3/uL (ref 0.0–0.5)
Eosinophils Relative: 1 %
HCT: 36.8 % (ref 36.0–46.0)
Hemoglobin: 12.3 g/dL (ref 12.0–15.0)
Immature Granulocytes: 0 %
Lymphocytes Relative: 24 %
Lymphs Abs: 2.5 10*3/uL (ref 0.7–4.0)
MCH: 30.1 pg (ref 26.0–34.0)
MCHC: 33.4 g/dL (ref 30.0–36.0)
MCV: 90.2 fL (ref 80.0–100.0)
Monocytes Absolute: 0.5 10*3/uL (ref 0.1–1.0)
Monocytes Relative: 5 %
Neutro Abs: 7.3 10*3/uL (ref 1.7–7.7)
Neutrophils Relative %: 70 %
Platelets: 280 10*3/uL (ref 150–400)
RBC: 4.08 MIL/uL (ref 3.87–5.11)
RDW: 12.3 % (ref 11.5–15.5)
WBC: 10.4 10*3/uL (ref 4.0–10.5)
nRBC: 0 % (ref 0.0–0.2)

## 2023-07-28 LAB — COMPREHENSIVE METABOLIC PANEL WITH GFR
ALT: 13 U/L (ref 0–44)
AST: 11 U/L — ABNORMAL LOW (ref 15–41)
Albumin: 3.5 g/dL (ref 3.5–5.0)
Alkaline Phosphatase: 50 U/L (ref 38–126)
Anion gap: 10 (ref 5–15)
BUN: 5 mg/dL — ABNORMAL LOW (ref 6–20)
CO2: 20 mmol/L — ABNORMAL LOW (ref 22–32)
Calcium: 9.5 mg/dL (ref 8.9–10.3)
Chloride: 105 mmol/L (ref 98–111)
Creatinine, Ser: 0.56 mg/dL (ref 0.44–1.00)
GFR, Estimated: >60 mL/min
Glucose, Bld: 94 mg/dL (ref 70–99)
Potassium: 3.5 mmol/L (ref 3.5–5.1)
Sodium: 135 mmol/L (ref 135–145)
Total Bilirubin: 0.4 mg/dL (ref 0.3–1.2)
Total Protein: 7.1 g/dL (ref 6.5–8.1)

## 2023-07-28 LAB — MAGNESIUM: Magnesium: 1.7 mg/dL (ref 1.7–2.4)

## 2023-07-28 LAB — HCG, QUANTITATIVE, PREGNANCY: hCG, Beta Chain, Quant, S: 37381 m[IU]/mL — ABNORMAL HIGH

## 2023-07-28 MED ORDER — ACETAMINOPHEN 500 MG PO TABS
1000.0000 mg | ORAL_TABLET | Freq: Once | ORAL | Status: AC
  Administered 2023-07-28: 1000 mg via ORAL
  Filled 2023-07-28: qty 2

## 2023-07-28 NOTE — ED Provider Notes (Signed)
[facility] Provider Note   CSN: [id number] Arrival date & time: 07/28/23  1701     History  Chief Complaint  Patient presents with   Headache    Jessica Hopkins is a 27 y.o. female who is 17 weeks 5 days pregnant, presents with concern for weakness and feeling more tired for the past 2 days. States she has not been eating or drinking as well due to intermittent nausea.  She also reports a headache behind her forehead with some associated photophobia, ongoing for 1 day. Denies any chest pain, shortness of breath, changes in vision, cough, fever, chills, abdominal pain.   Headache      Home Medications Prior to Admission medications   Medication Sig Start Date End Date Taking? Authorizing Provider  amoxicillin (AMOXIL) 500 MG capsule Take 1 capsule (500 mg total) by mouth 3 (three) times daily. 12/01/15   Pisciotta, Joni Reining, PA-C  HYDROcodone-acetaminophen (NORCO/VICODIN) 5-325 MG tablet Take 1 tablet by mouth every 4 (four) hours as needed for severe pain. 01/27/16   Eliseo Squires, PA-C  metroNIDAZOLE (FLAGYL) 500 MG tablet Take 1 tablet (500 mg total) by mouth 2 (two) times daily. 05/18/23   Carlynn Herald, CNM  naproxen (NAPROSYN) 500 MG tablet Take 1 tablet (500 mg total) by mouth 2 (two) times daily. 11/16/15   Ward, Chase Picket, PA-C  naproxen (NAPROSYN) 500 MG tablet Take 1 tablet (500 mg total) by mouth 2 (two) times daily. 01/27/16   Eliseo Squires, PA-C  predniSONE (DELTASONE) 20 MG tablet Take 2 tablets (40 mg total) by mouth daily. 12/01/15   Pisciotta, Joni Reining, PA-C      Allergies    Patient has no known allergies.    Review of Systems   Review of Systems  Neurological:  Positive for headaches.    Physical Exam Updated Vital Signs BP 122/83   Pulse 76   Temp 99 F (37.2 C)   Resp 18   Ht 5\' 3"  (1.6 m)   Wt 101.6 kg   LMP 03/26/2023   SpO2 99%   BMI 39.68 kg/m  Physical Exam Vitals and nursing note  reviewed.  Constitutional:      General: She is not in acute distress.    Appearance: She is well-developed.  HENT:     Head: Normocephalic and atraumatic.     Mouth/Throat:     Comments: Mucous membranes dry  Eyes:     Extraocular Movements: Extraocular movements intact.     Conjunctiva/sclera: Conjunctivae normal.     Pupils: Pupils are equal, round, and reactive to light.  Cardiovascular:     Rate and Rhythm: Normal rate and regular rhythm.     Heart sounds: No murmur heard. Pulmonary:     Effort: Pulmonary effort is normal. No respiratory distress.     Breath sounds: Normal breath sounds.  Abdominal:     Palpations: Abdomen is soft.     Tenderness: There is no abdominal tenderness.  Musculoskeletal:        General: No swelling.     Cervical back: Neck supple.  Skin:    General: Skin is warm and dry.     Capillary Refill: Capillary refill takes 2 to 3 seconds.  Neurological:     Mental Status: She is alert.  Psychiatric:        Mood and Affect: Mood normal.     ED Results / Procedures / Treatments   Labs (all labs ordered  are listed, but only abnormal results are displayed) Labs Reviewed  COMPREHENSIVE METABOLIC PANEL - Abnormal; Notable for the following components:      Result Value   CO2 20 (*)    BUN 5 (*)    AST 11 (*)    All other components within normal limits  HCG, QUANTITATIVE, PREGNANCY - Abnormal; Notable for the following components:   hCG, Beta Chain, Quant, S [id number] (*)    All other components within normal limits  CBC WITH DIFFERENTIAL/PLATELET  MAGNESIUM    EKG None  Radiology No results found.  Procedures Procedures    Medications Ordered in ED Medications  acetaminophen (TYLENOL) tablet 1,000 mg (1,000 mg Oral Given 07/28/23 1906)    ED Course/ Medical Decision Making/ A&P                                 Medical Decision Making Amount and/or Complexity of Data Reviewed Labs: ordered.  Risk OTC drugs.   26 y.o. female  who is [redacted]w[redacted]d pregnant with no other significant PMH presents to the ED for concern of headache for 1 day, feeling more weak and tired for the past 2 days  Differential diagnosis includes but is not limited to dehydration, electrolyte abnormality, covid, flu, preeclampsia, tension headache  ED Course:  Patient overall well-appearing, normal vital signs.  She reports frontal headache for the past day.  Blood pressure 122/83, no concern for preeclampsia at this time.  No lab abnormalities, no concern for HELLP syndrome at this time.  Tylenol given for headache. Patient reports feeling more weak and more tired than normal over the past 2 days. States she has not been eating and drinking as well due to some nausea. Her mucous membranes are dry. Suspect dehydration contributing to headache and fatigue/feelings of weakness. No electrolyte abnormalities. No concerning symptoms such as vision changes, dizziness, chest pain, shortness of breath, abdominal pain. No fever or chills. Appropriate for discharge home at this time.    Impression: Dehydration Tension headache  Disposition:  The patient was discharged home with instructions to take  tylenol as needed for headache. Keep well hydrated with water and pedialyte. Follow up with PCP or OBGYN if symptoms do not start to improve within the next 2-3 days.  Return precautions given.  Lab Tests: I Ordered, and personally interpreted labs.  The pertinent results include:   CBC, CMP unremarkable Magnesium within normal limits Beta hcg 37,381            Final Clinical Impression(s) / ED Diagnoses Final diagnoses:  Dehydration  Tension headache    Rx / DC Orders ED Discharge Orders     None         Arabella Merles, PA-C 07/28/23 2022    Melene Plan, DO 07/28/23 2216

## 2023-07-28 NOTE — ED Triage Notes (Signed)
Pt reports that she is almost [redacted] weeks pregnant. States that weak since Saturday. Also states that she has a headache.

## 2023-07-28 NOTE — Discharge Instructions (Addendum)
Your lab work is reassuring today.  As discussed, please keep well-hydrated at home with water and electrolyte drinks such as Pedialyte.  Please aim to drink at least 80oz of fluids per day.   Please follow-up with your primary care or OB/GYN if symptoms do not start to improve within the next 3 days.  You were given 1000 mg of Tylenol here today at 7 PM.  You may take up to 1000mg  of tylenol every 6 hours as needed for pain.  Do not take more then 4g per day.  Return to the ER if you have any double vision, loss of consciousness, chest pain, headache that does not go away, any other new or concerning symptoms.
